# Patient Record
Sex: Male | Born: 1965 | Race: White | Hispanic: No | Marital: Married
Health system: Southern US, Community
[De-identification: ages and names within clinical notes are randomized; demographics above are authoritative.]

## PROBLEM LIST (undated history)

## (undated) DIAGNOSIS — K219 Gastro-esophageal reflux disease without esophagitis: Secondary | ICD-10-CM

---

## 2003-04-20 ENCOUNTER — Emergency Department (HOSPITAL_COMMUNITY): Admission: EM | Admit: 2003-04-20 | Discharge: 2003-04-20 | Payer: Self-pay | Admitting: Emergency Medicine

## 2005-12-14 ENCOUNTER — Ambulatory Visit: Payer: Self-pay | Admitting: Internal Medicine

## 2017-07-30 ENCOUNTER — Emergency Department (HOSPITAL_COMMUNITY): Payer: BLUE CROSS/BLUE SHIELD

## 2017-07-30 ENCOUNTER — Encounter (HOSPITAL_COMMUNITY): Payer: Self-pay | Admitting: *Deleted

## 2017-07-30 ENCOUNTER — Emergency Department (HOSPITAL_COMMUNITY)
Admission: EM | Admit: 2017-07-30 | Discharge: 2017-07-30 | Disposition: A | Discharge status: Home / Self Care | Payer: BLUE CROSS/BLUE SHIELD | Attending: Emergency Medicine | Admitting: Emergency Medicine

## 2017-07-30 DIAGNOSIS — K219 Gastro-esophageal reflux disease without esophagitis: Secondary | ICD-10-CM | POA: Insufficient documentation

## 2017-07-30 DIAGNOSIS — N200 Calculus of kidney: Secondary | ICD-10-CM | POA: Diagnosis not present

## 2017-07-30 DIAGNOSIS — R110 Nausea: Secondary | ICD-10-CM | POA: Diagnosis not present

## 2017-07-30 DIAGNOSIS — R1012 Left upper quadrant pain: Secondary | ICD-10-CM | POA: Diagnosis present

## 2017-07-30 DIAGNOSIS — R109 Unspecified abdominal pain: Secondary | ICD-10-CM

## 2017-07-30 HISTORY — DX: Gastro-esophageal reflux disease without esophagitis: K21.9

## 2017-07-30 LAB — URINALYSIS, ROUTINE W REFLEX MICROSCOPIC
BILIRUBIN URINE: NEGATIVE
Bacteria, UA: NONE SEEN
GLUCOSE, UA: NEGATIVE mg/dL
KETONES UR: NEGATIVE mg/dL
LEUKOCYTES UA: NEGATIVE
NITRITE: NEGATIVE
PH: 7 (ref 5.0–8.0)
PROTEIN: NEGATIVE mg/dL
SQUAMOUS EPITHELIAL / LPF: NONE SEEN
Specific Gravity, Urine: 1.003 — ABNORMAL LOW (ref 1.005–1.030)

## 2017-07-30 LAB — COMPREHENSIVE METABOLIC PANEL
ALT: 29 U/L (ref 17–63)
AST: 25 U/L (ref 15–41)
Albumin: 4.3 g/dL (ref 3.5–5.0)
Alkaline Phosphatase: 59 U/L (ref 38–126)
Anion gap: 8 (ref 5–15)
BILIRUBIN TOTAL: 0.6 mg/dL (ref 0.3–1.2)
BUN: 15 mg/dL (ref 6–20)
CO2: 22 mmol/L (ref 22–32)
Calcium: 8.8 mg/dL — ABNORMAL LOW (ref 8.9–10.3)
Chloride: 103 mmol/L (ref 101–111)
Creatinine, Ser: 0.64 mg/dL (ref 0.61–1.24)
Glucose, Bld: 103 mg/dL — ABNORMAL HIGH (ref 65–99)
POTASSIUM: 4.5 mmol/L (ref 3.5–5.1)
Sodium: 133 mmol/L — ABNORMAL LOW (ref 135–145)
TOTAL PROTEIN: 7.3 g/dL (ref 6.5–8.1)

## 2017-07-30 LAB — CBC WITH DIFFERENTIAL/PLATELET
BASOS ABS: 0 10*3/uL (ref 0.0–0.1)
Basophils Relative: 0 %
EOS ABS: 0 10*3/uL (ref 0.0–0.7)
EOS PCT: 0 %
HCT: 40.8 % (ref 39.0–52.0)
HEMOGLOBIN: 13.4 g/dL (ref 13.0–17.0)
LYMPHS PCT: 12 %
Lymphs Abs: 1.1 10*3/uL (ref 0.7–4.0)
MCH: 29.6 pg (ref 26.0–34.0)
MCHC: 32.8 g/dL (ref 30.0–36.0)
MCV: 90.1 fL (ref 78.0–100.0)
Monocytes Absolute: 0.6 10*3/uL (ref 0.1–1.0)
Monocytes Relative: 6 %
NEUTROS PCT: 82 %
Neutro Abs: 7.7 10*3/uL (ref 1.7–7.7)
PLATELETS: 336 10*3/uL (ref 150–400)
RBC: 4.53 MIL/uL (ref 4.22–5.81)
RDW: 13.4 % (ref 11.5–15.5)
WBC: 9.5 10*3/uL (ref 4.0–10.5)

## 2017-07-30 MED ORDER — IBUPROFEN 800 MG PO TABS: 800.0000 mg | ORAL_TABLET | Freq: Three times a day (TID) | ORAL | 0 refills | Status: AC | PRN

## 2017-07-30 MED ORDER — KETOROLAC TROMETHAMINE 30 MG/ML IJ SOLN
30.0000 mg | Freq: Once | INTRAMUSCULAR | Status: AC
  Administered 2017-07-30: 30 mg via INTRAVENOUS
  Filled 2017-07-30: qty 1

## 2017-07-30 MED ORDER — HYDROCODONE-ACETAMINOPHEN 5-325 MG PO TABS: 2.0000 | ORAL_TABLET | ORAL | 0 refills | Status: AC | PRN

## 2017-07-30 MED ORDER — TAMSULOSIN HCL 0.4 MG PO CAPS: 0.4000 mg | ORAL_CAPSULE | Freq: Every day | ORAL | 0 refills | Status: AC

## 2017-07-30 NOTE — ED Provider Notes (Signed)
Emergency Department Provider Note   I have reviewed the triage vital signs and the nursing notes.   HISTORY  Chief Complaint Flank Pain   HPI Travis Khan is a 51 y.o. male with PMH of GERD presents to the emergency department for evaluation of sudden onset left flank pain at 7 AM today. Patient describes it as severe, sharp, radiating to the groin. He had some difficulty urinating. No gross hematuria. No fevers or chills. When pain was severe he had some nausea but no vomiting. No tingling or numbness in the legs. No chest pain or difficulty breathing. No history of kidney stones or prior pain.    Past Medical History:  Diagnosis Date  . GERD (gastroesophageal reflux disease)     There are no active problems to display for this patient.   History reviewed. No pertinent surgical history.    Allergies Patient has no known allergies.  No family history on file.  Social History Social History  Substance Use Topics  . Smoking status: Not on file  . Smokeless tobacco: Never Used  . Alcohol use Yes     Comment: socially    Review of Systems  Constitutional: No fever/chills Eyes: No visual changes. ENT: No sore throat. Cardiovascular: Denies chest pain. Respiratory: Denies shortness of breath. Gastrointestinal: No abdominal pain. Positive left flank pain. No nausea, no vomiting.  No diarrhea.  No constipation. Genitourinary: Negative for dysuria.  Musculoskeletal: Negative for back pain. Skin: Negative for rash. Neurological: Negative for headaches, focal weakness or numbness.  10-point ROS otherwise negative.  ____________________________________________   PHYSICAL EXAM:  VITAL SIGNS: ED Triage Vitals [07/30/17 1444]  Enc Vitals Group     BP 138/77     Pulse Rate 79     Resp 18     Temp 98.8 F (37.1 C)     Temp Source Oral     SpO2 96 %     Weight 270 lb (122.5 kg)     Height 5\' 10"  (1.778 m)     Pain Score 7   Constitutional: Alert and  oriented. Well appearing and in no acute distress. Eyes: Conjunctivae are normal.  Head: Atraumatic. Nose: No congestion/rhinnorhea. Mouth/Throat: Mucous membranes are moist.  Neck: No stridor.  Cardiovascular: Normal rate, regular rhythm. Good peripheral circulation. Grossly normal heart sounds.   Respiratory: Normal respiratory effort.  No retractions. Lungs CTAB. Gastrointestinal: Soft and nontender. No distention. Positive left CVA tenderness to percussion.  Musculoskeletal: No lower extremity tenderness nor edema. No gross deformities of extremities. Neurologic:  Normal speech and language. No gross focal neurologic deficits are appreciated.  Skin:  Skin is warm, dry and intact. No rash noted.  ____________________________________________   LABS (all labs ordered are listed, but only abnormal results are displayed)  Labs Reviewed  URINALYSIS, ROUTINE W REFLEX MICROSCOPIC - Abnormal; Notable for the following:       Result Value   Color, Urine COLORLESS (*)    Specific Gravity, Urine 1.003 (*)    Hgb urine dipstick SMALL (*)    All other components within normal limits  COMPREHENSIVE METABOLIC PANEL - Abnormal; Notable for the following:    Sodium 133 (*)    Glucose, Bld 103 (*)    Calcium 8.8 (*)    All other components within normal limits  CBC WITH DIFFERENTIAL/PLATELET   ____________________________________________  RADIOLOGY  Ct Renal Stone Study  Result Date: 07/30/2017 CLINICAL DATA:  Left flank pain and dysuria EXAM: CT ABDOMEN AND PELVIS  WITHOUT CONTRAST TECHNIQUE: Multidetector CT imaging of the abdomen and pelvis was performed following the standard protocol without oral or intravenous contrast material administration. COMPARISON:  Report of prior chest CT February 13, 2013 available; images from that study not available. FINDINGS: Lower chest: There is a 3 mm nodular opacity in the lateral segment of the left lower lobe seen on axial slice 10 series 4. Lung bases are  otherwise clear. There is a small hiatal hernia. Hepatobiliary: No focal liver lesions are evident on this noncontrast enhanced study. Gallbladder wall is not appreciably thickened. There is no biliary duct dilatation. Pancreas: There is no pancreatic mass or inflammatory focus. Spleen: No splenic lesions are evident. There is a small splenule slightly superior to the spleen. Adrenals/Urinary Tract: Adrenals appear normal bilaterally. There is a cyst arising from the lateral mid right kidney measuring 2.0 x 2.0 cm. There is moderate hydronephrosis on the left. There is no appreciable hydronephrosis on the right. There is no intrarenal calculus on either side. There is a 2 mm calculus at the left ureterovesical junction. No other ureteral calculi are evident on either side. Urinary bladder is midline with wall thickness within normal limits. Stomach/Bowel: There are scattered sigmoid diverticula without diverticulitis. There is no bowel wall or mesenteric thickening. No bowel obstruction. No free air or portal venous air. Vascular/Lymphatic: There is slight atherosclerotic calcification in the aorta. Aorta is mildly tortuous, but there is no aneurysm. Major mesenteric vessels appear patent on this noncontrast enhanced study. Reproductive: Prostate and seminal vesicles appear normal in size and contour. No evident pelvic mass. Other: Appendix appears normal. No abscess or ascites evident in the abdomen or pelvis. There is a small ventral hernia containing only fat. There is fat in each inguinal ring. Musculoskeletal: There are no blastic or lytic bone lesions. No intramuscular or abdominal wall lesion. IMPRESSION: 1. 2 mm calculus left ureterovesical junction with moderate hydronephrosis on the left. 2. Sigmoid diverticula without diverticulitis. No bowel obstruction. No abscess. Appendix appears normal. 3. 3 mm nodular opacity lateral segment left lower lobe. Report of a nodular lesion in this area noted on prior  study. No images available from prior study to compare directly. No follow-up needed if patient is low-risk. Non-contrast chest CT can be considered in 12 months if patient is high-risk. This recommendation follows the consensus statement: Guidelines for Management of Incidental Pulmonary Nodules Detected on CT Images: From the Fleischner Society 2017; Radiology 2017; 284:228-243. 4. Small ventral hernia containing only fat. Small hiatal hernia. Fat in each inguinal ring. Electronically Signed   By: Bretta BangWilliam  Woodruff III M.D.   On: 07/30/2017 16:07    ____________________________________________   PROCEDURES  Procedure(s) performed:   Procedures  None ____________________________________________   INITIAL IMPRESSION / ASSESSMENT AND PLAN / ED COURSE  Pertinent labs & imaging results that were available during my care of the patient were reviewed by me and considered in my medical decision making (see chart for details).  Patient presents to the emergency department for evaluation of left flank pain. Symptoms concerning for nephrolithiasis. No history of similar. Plan for CT renal protocol with labs, UA, and Toradol for pain.   04:35 PM Patient with 2 mm stone at the left UVJ. Plan for symptom mgmt and Urology follow up. No Urosepsis or AKI.   At this time, I do not feel there is any life-threatening condition present. I have reviewed and discussed all results (EKG, imaging, lab, urine as appropriate), exam findings with patient. I have  reviewed nursing notes and appropriate previous records.  I feel the patient is safe to be discharged home without further emergent workup. Discussed usual and customary return precautions. Patient and family (if present) verbalize understanding and are comfortable with this plan.  Patient will follow-up with their primary care provider. If they do not have a primary care provider, information for follow-up has been provided to them. All questions have been  answered.  ____________________________________________  FINAL CLINICAL IMPRESSION(S) / ED DIAGNOSES  Final diagnoses:  Nephrolithiasis  Left flank pain     MEDICATIONS GIVEN DURING THIS VISIT:  Medications  ketorolac (TORADOL) 30 MG/ML injection 30 mg (30 mg Intravenous Given 07/30/17 1526)     NEW OUTPATIENT MEDICATIONS STARTED DURING THIS VISIT:  Discharge Medication List as of 07/30/2017  4:38 PM    START taking these medications   Details  HYDROcodone-acetaminophen (NORCO/VICODIN) 5-325 MG tablet Take 2 tablets by mouth every 4 (four) hours as needed., Starting Tue 07/30/2017, Print    ibuprofen (ADVIL,MOTRIN) 800 MG tablet Take 1 tablet (800 mg total) by mouth every 8 (eight) hours as needed., Starting Tue 07/30/2017, Print    tamsulosin (FLOMAX) 0.4 MG CAPS capsule Take 1 capsule (0.4 mg total) by mouth daily., Starting Tue 07/30/2017, Until Tue 08/13/2017, Print        Note:  This document was prepared using Dragon voice recognition software and may include unintentional dictation errors.  Alona Bene, MD Emergency Medicine    Revecca Nachtigal, Arlyss Repress, MD 07/31/17 [phone]

## 2017-07-30 NOTE — ED Triage Notes (Signed)
Pt comes in with left flank pain starting at 0700 today. He has had some trouble urinating. Denies any kidney stone history.

## 2017-07-30 NOTE — Discharge Instructions (Signed)
You have been seen in the Emergency Department (ED) today for pain that we believe based on your workup, is caused by kidney stones.  As we have discussed, please drink plenty of fluids.  Please make a follow up appointment with the physician(s) listed elsewhere in this documentation. ° °You may take pain medication as needed but ONLY as prescribed.  Please also take your prescribed Flomax daily.  We also recommend that you take over-the-counter ibuprofen regularly according to label instructions over the next 5 days.  Take it with meals to minimize stomach discomfort. ° °Please see your doctor as soon as possible as stones may take 1-3 weeks to pass and you may require additional care or medications. ° °Do not drink alcohol, drive or participate in any other potentially dangerous activities while taking opiate pain medication as it may make you sleepy. Do not take this medication with any other sedating medications, either prescription or over-the-counter. If you were prescribed Percocet or Vicodin, do not take these with acetaminophen (Tylenol) as it is already contained within these medications. °  °Take Norco as needed for severe pain.  This medication is an opiate (or narcotic) pain medication and can be habit forming.  Use it as little as possible to achieve adequate pain control.  Do not use or use it with extreme caution if you have a history of opiate abuse or dependence.  If you are on a pain contract with your primary care doctor or a pain specialist, be sure to let them know you were prescribed this medication today from the Emergency Department.  This medication is intended for your use only - do not give any to anyone else and keep it in a secure place where nobody else, especially children, have access to it.  It will also cause or worsen constipation, so you may want to consider taking an over-the-counter stool softener while you are taking this medication. ° °Return to the Emergency Department (ED)  or call your doctor if you have any worsening pain, fever, painful urination, are unable to urinate, or develop other symptoms that concern you. ° ° ° °Kidney Stones °Kidney stones (urolithiasis) are deposits that form inside your kidneys. The intense pain is caused by the stone moving through the urinary tract. When the stone moves, the ureter goes into spasm around the stone. The stone is usually passed in the urine.  °CAUSES  °A disorder that makes certain neck glands produce too much parathyroid hormone (primary hyperparathyroidism). °A buildup of uric acid crystals, similar to gout in your joints. °Narrowing (stricture) of the ureter. °A kidney obstruction present at birth (congenital obstruction). °Previous surgery on the kidney or ureters. °Numerous kidney infections. °SYMPTOMS  °Feeling sick to your stomach (nauseous). °Throwing up (vomiting). °Blood in the urine (hematuria). °Pain that usually spreads (radiates) to the groin. °Frequency or urgency of urination. °DIAGNOSIS  °Taking a history and physical exam. °Blood or urine tests. °CT scan. °Occasionally, an examination of the inside of the urinary bladder (cystoscopy) is performed. °TREATMENT  °Observation. °Increasing your fluid intake. °Extracorporeal shock wave lithotripsy--This is a noninvasive procedure that uses shock waves to break up kidney stones. °Surgery may be needed if you have severe pain or persistent obstruction. There are various surgical procedures. Most of the procedures are performed with the use of small instruments. Only small incisions are needed to accommodate these instruments, so recovery time is minimized. °The size, location, and chemical composition are all important variables that will determine the proper   choice of action for you. Talk to your health care provider to better understand your situation so that you will minimize the risk of injury to yourself and your kidney.  °HOME CARE INSTRUCTIONS  °Drink enough water and  fluids to keep your urine clear or pale yellow. This will help you to pass the stone or stone fragments. °Strain all urine through the provided strainer. Keep all particulate matter and stones for your health care provider to see. The stone causing the pain may be as small as a grain of salt. It is very important to use the strainer each and every time you pass your urine. The collection of your stone will allow your health care provider to analyze it and verify that a stone has actually passed. The stone analysis will often identify what you can do to reduce the incidence of recurrences. °Only take over-the-counter or prescription medicines for pain, discomfort, or fever as directed by your health care provider. °Keep all follow-up visits as told by your health care provider. This is important. °Get follow-up X-rays if required. The absence of pain does not always mean that the stone has passed. It may have only stopped moving. If the urine remains completely obstructed, it can cause loss of kidney function or even complete destruction of the kidney. It is your responsibility to make sure X-rays and follow-ups are completed. Ultrasounds of the kidney can show blockages and the status of the kidney. Ultrasounds are not associated with any radiation and can be performed easily in a matter of minutes. °Make changes to your daily diet as told by your health care provider. You may be told to: °Limit the amount of salt that you eat. °Eat 5 or more servings of fruits and vegetables each day. °Limit the amount of meat, poultry, fish, and eggs that you eat. °Collect a 24-hour urine sample as told by your health care provider. You may need to collect another urine sample every 6-12 months. °SEEK MEDICAL CARE IF: °You experience pain that is progressive and unresponsive to any pain medicine you have been prescribed. °SEEK IMMEDIATE MEDICAL CARE IF:  °Pain cannot be controlled with the prescribed medicine. °You have a fever or  shaking chills. °The severity or intensity of pain increases over 18 hours and is not relieved by pain medicine. °You develop a new onset of abdominal pain. °You feel faint or pass out. °You are unable to urinate. °  °This information is not intended to replace advice given to you by your health care provider. Make sure you discuss any questions you have with your health care provider. °  °Document Released: 11/12/2005 Document Revised: 08/03/2015 Document Reviewed: 04/15/2013 °Elsevier Interactive Patient Education ©2016 [employer] ° ° °

## 2017-07-30 NOTE — ED Notes (Signed)
Pt to ct 

## 2018-08-10 IMAGING — CT CT RENAL STONE PROTOCOL
2 of 4 series · 15 of 46 positions shown, 17 images · non-contrast
Comparison: Report of prior chest CT February 13, 2013 available;
images from that study not available.

CLINICAL DATA: Left flank pain and dysuria

EXAM:
CT ABDOMEN AND PELVIS WITHOUT CONTRAST
TECHNIQUE: Multidetector CT imaging of the abdomen and pelvis was performed
following the standard protocol without oral or intravenous contrast
material administration.

[Series 2: axial st · axial · 0.86mm/px · z∈[+885,+1365]mm · 12 of 106 slices shown, 14 images]
[im 5/106  soft-tissue]
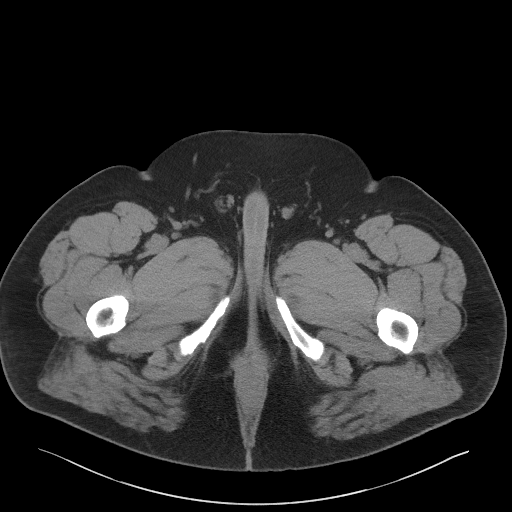
[im 5/106  bone]
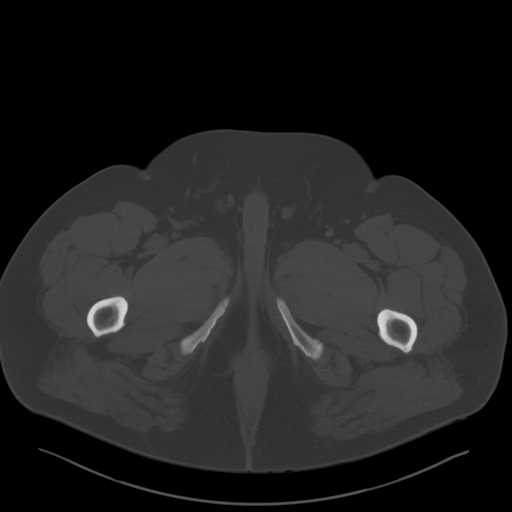
[im 14/106  soft-tissue]
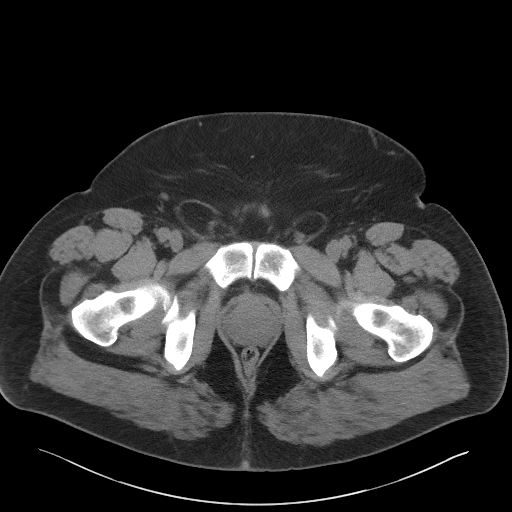
[im 23/106  soft-tissue]
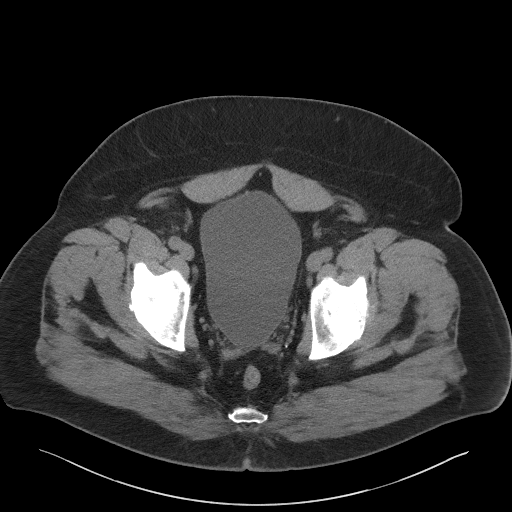
[im 32/106  soft-tissue]
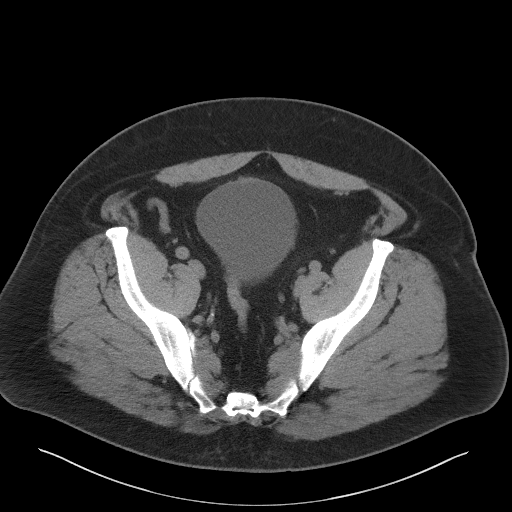
[im 42/106  soft-tissue]
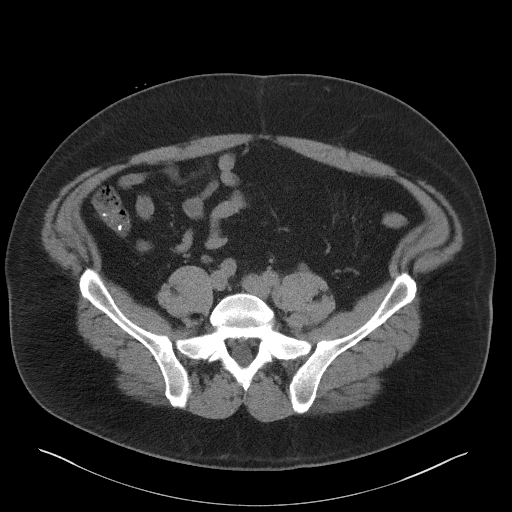
[im 51/106  soft-tissue]
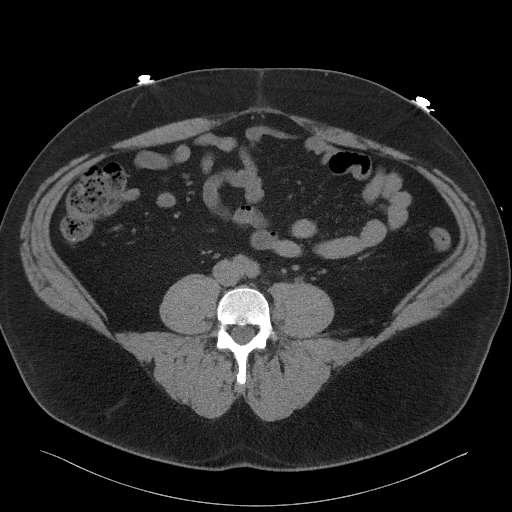
[im 55/106  soft-tissue]
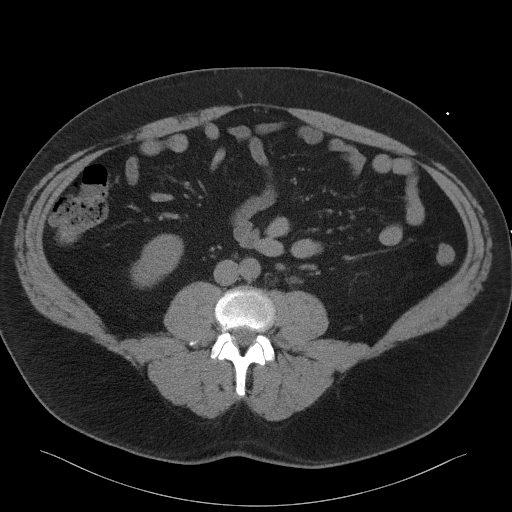
[im 64/106  soft-tissue]
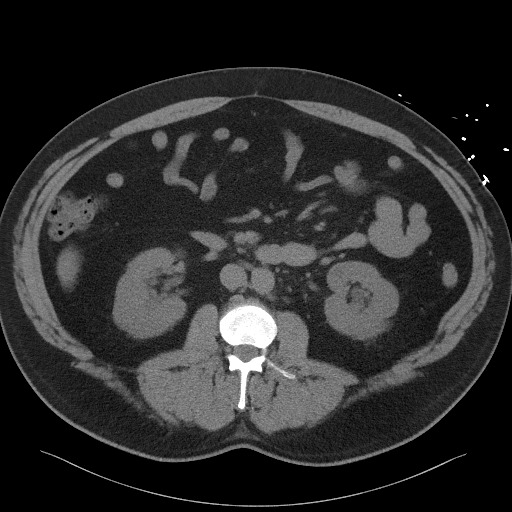
[im 74/106  soft-tissue]
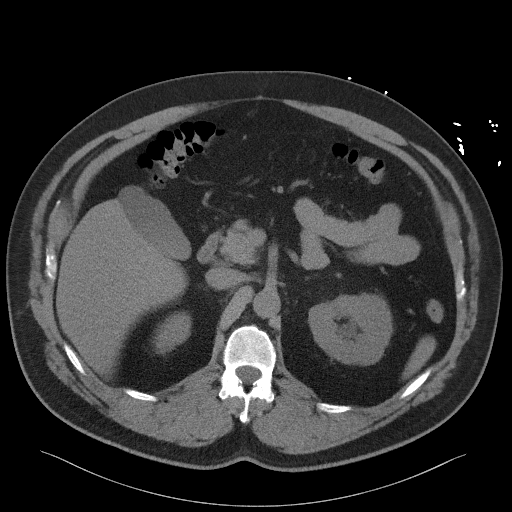
[im 74/106  bone]
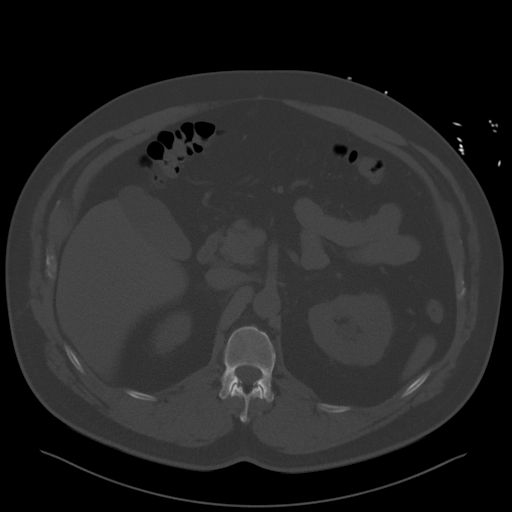
[im 83/106  soft-tissue]
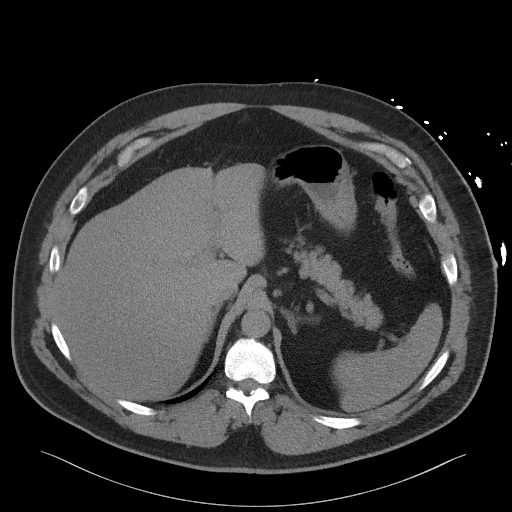
[im 92/106  soft-tissue]
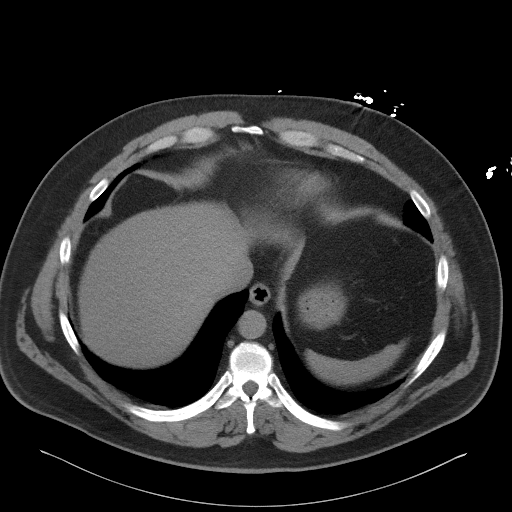
[im 101/106  soft-tissue]
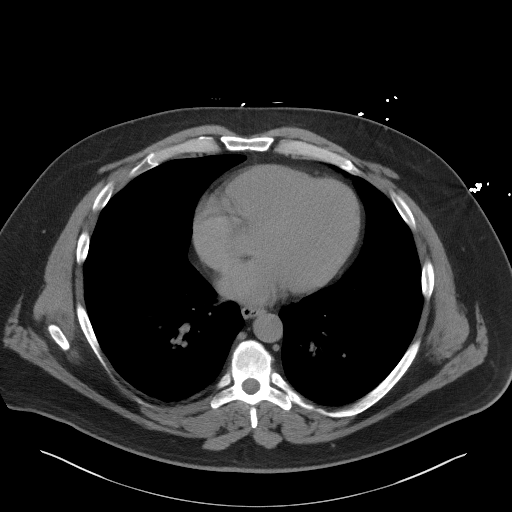

[Series 5: coronal st · coronal · 0.89mm/px · 3 of 114 slices shown]
[im 38/114  soft-tissue]
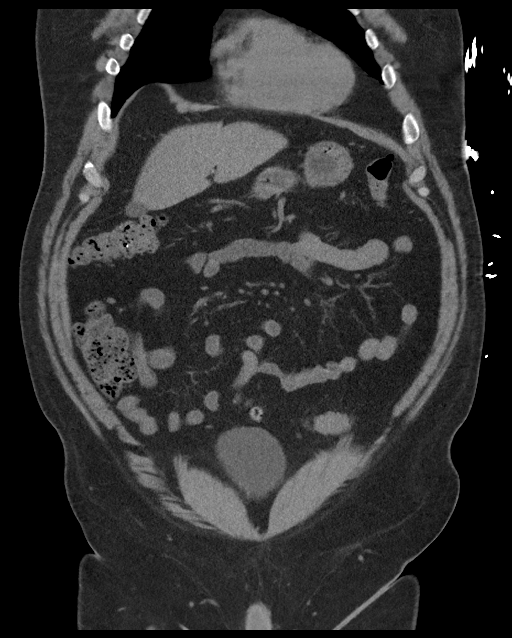
[im 51/114  soft-tissue]
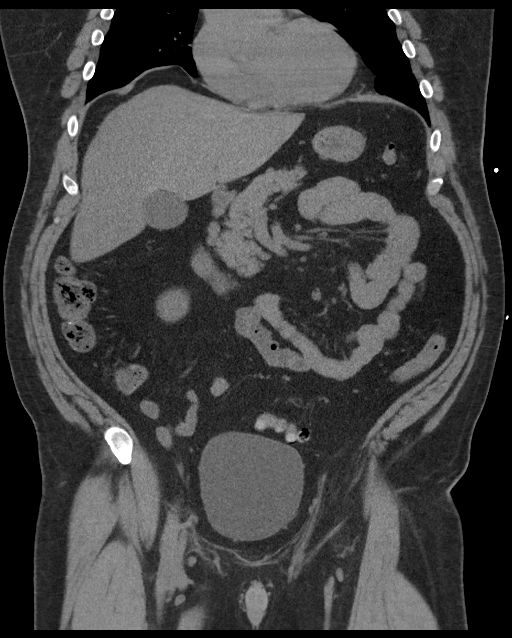
[im 63/114  soft-tissue]
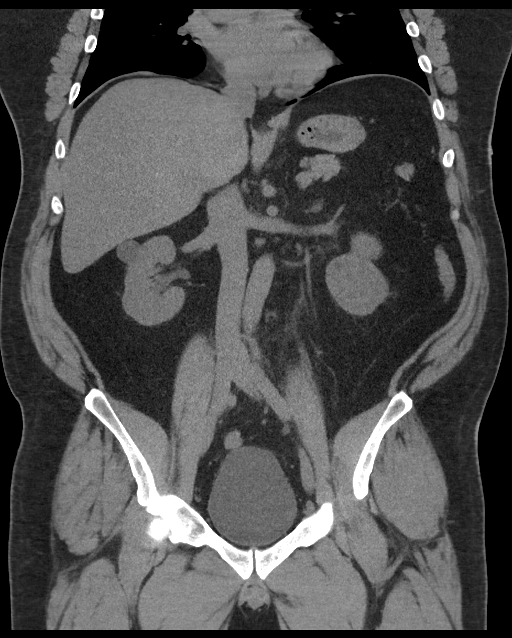

[15 of 46 positions shown; findings below may reference images not displayed]

FINDINGS: Lower chest: There is a 3 mm nodular opacity in the lateral segment
of the left lower lobe seen on axial slice 10 series 4. Lung bases
are otherwise clear. There is a small hiatal hernia.

Hepatobiliary: No focal liver lesions are evident on this
noncontrast enhanced study. Gallbladder wall is not appreciably
thickened. There is no biliary duct dilatation.

Pancreas: There is no pancreatic mass or inflammatory focus.

Spleen: No splenic lesions are evident. There is a small splenule
slightly superior to the spleen.

Adrenals/Urinary Tract: Adrenals appear normal bilaterally. There is
a cyst arising from the lateral mid right kidney measuring 2.0 x
cm. There is moderate hydronephrosis on the left. There is no
appreciable hydronephrosis on the right. There is no intrarenal
calculus on either side. There is a 2 mm calculus at the left
ureterovesical junction. No other ureteral calculi are evident on
either side. Urinary bladder is midline with wall thickness within
normal limits.

Stomach/Bowel: There are scattered sigmoid diverticula without
diverticulitis. There is no bowel wall or mesenteric thickening. No
bowel obstruction. No free air or portal venous air.

Vascular/Lymphatic: There is slight atherosclerotic calcification in
the aorta. Aorta is mildly tortuous, but there is no aneurysm. Major
mesenteric vessels appear patent on this noncontrast enhanced study.

Reproductive: Prostate and seminal vesicles appear normal in size
and contour. No evident pelvic mass.

Other: Appendix appears normal. No abscess or ascites evident in the
abdomen or pelvis. There is a small ventral hernia containing only
fat. There is fat in each inguinal ring.

Musculoskeletal: There are no blastic or lytic bone lesions. No
intramuscular or abdominal wall lesion.
IMPRESSION: 1. 2 mm calculus left ureterovesical junction with moderate
hydronephrosis on the left.

2. Sigmoid diverticula without diverticulitis. No bowel obstruction.
No abscess. Appendix appears normal.

3. 3 mm nodular opacity lateral segment left lower lobe. Report of a
nodular lesion in this area noted on prior study. No images
available from prior study to compare directly. No follow-up needed
if patient is low-risk. Non-contrast chest CT can be considered in
12 months if patient is high-risk. This recommendation follows the
consensus statement: Guidelines for Management of Incidental
Pulmonary Nodules Detected on CT Images: From the [HOSPITAL]

4. Small ventral hernia containing only fat. Small hiatal hernia.
Fat in each inguinal ring.
